# Patient Record
Sex: Male | Born: 1990 | Race: Black or African American | Hispanic: No | Marital: Single | Smoking: Never smoker
Health system: Southern US, Community
[De-identification: ages and names within clinical notes are randomized; demographics above are authoritative.]

## PROBLEM LIST (undated history)

## (undated) DIAGNOSIS — G8929 Other chronic pain: Secondary | ICD-10-CM

## (undated) DIAGNOSIS — M549 Dorsalgia, unspecified: Secondary | ICD-10-CM

---

## 2013-11-14 ENCOUNTER — Encounter (HOSPITAL_COMMUNITY): Payer: Self-pay | Admitting: Emergency Medicine

## 2013-11-14 ENCOUNTER — Emergency Department (HOSPITAL_COMMUNITY)
Admission: EM | Admit: 2013-11-14 | Discharge: 2013-11-14 | Disposition: A | Discharge status: Home / Self Care | Payer: Self-pay | Attending: Emergency Medicine | Admitting: Emergency Medicine

## 2013-11-14 ENCOUNTER — Emergency Department (HOSPITAL_COMMUNITY): Payer: Self-pay

## 2013-11-14 DIAGNOSIS — X500XXA Overexertion from strenuous movement or load, initial encounter: Secondary | ICD-10-CM | POA: Insufficient documentation

## 2013-11-14 DIAGNOSIS — G8929 Other chronic pain: Secondary | ICD-10-CM | POA: Insufficient documentation

## 2013-11-14 DIAGNOSIS — M5380 Other specified dorsopathies, site unspecified: Secondary | ICD-10-CM | POA: Insufficient documentation

## 2013-11-14 DIAGNOSIS — Y9389 Activity, other specified: Secondary | ICD-10-CM | POA: Insufficient documentation

## 2013-11-14 DIAGNOSIS — R52 Pain, unspecified: Secondary | ICD-10-CM | POA: Insufficient documentation

## 2013-11-14 DIAGNOSIS — M545 Low back pain: Secondary | ICD-10-CM

## 2013-11-14 DIAGNOSIS — IMO0002 Reserved for concepts with insufficient information to code with codable children: Secondary | ICD-10-CM | POA: Insufficient documentation

## 2013-11-14 DIAGNOSIS — Y929 Unspecified place or not applicable: Secondary | ICD-10-CM | POA: Insufficient documentation

## 2013-11-14 HISTORY — DX: Other chronic pain: G89.29

## 2013-11-14 HISTORY — DX: Dorsalgia, unspecified: M54.9

## 2013-11-14 MED ORDER — METHOCARBAMOL 500 MG PO TABS
500.0000 mg | ORAL_TABLET | Freq: Once | ORAL | Status: AC
  Administered 2013-11-14: 500 mg via ORAL
  Filled 2013-11-14: qty 1

## 2013-11-14 MED ORDER — NAPROXEN 250 MG PO TABS: 250.0000 mg | ORAL_TABLET | Freq: Two times a day (BID) | ORAL | Status: AC

## 2013-11-14 MED ORDER — IBUPROFEN 200 MG PO TABS
400.0000 mg | ORAL_TABLET | Freq: Once | ORAL | Status: AC
  Administered 2013-11-14: 400 mg via ORAL
  Filled 2013-11-14: qty 2

## 2013-11-14 MED ORDER — TRAMADOL HCL 50 MG PO TABS: 50.0000 mg | ORAL_TABLET | Freq: Four times a day (QID) | ORAL | Status: AC | PRN

## 2013-11-14 MED ORDER — METHOCARBAMOL 500 MG PO TABS: 1000.0000 mg | ORAL_TABLET | Freq: Four times a day (QID) | ORAL | Status: AC | PRN

## 2013-11-14 NOTE — ED Notes (Signed)
Bed: AC16WA10 Expected date:  Expected time:  Means of arrival:  Comments: EMS 23yo M back spasms

## 2013-11-14 NOTE — ED Notes (Signed)
Per EMS, pt. From his friend's house  Called EMS for back pain at 10/10 with spasm  at around 11 pm last night. Pt.claimed of having chronic back pain never been treated,pt. Stated he heard "pop" as he lifted that heavy object. No SOB.

## 2013-11-14 NOTE — ED Provider Notes (Signed)
CSN: [id number]     Arrival date & time 11/14/13  0041 History   First MD Initiated Contact with Patient 11/14/13 0048     Chief Complaint  Patient presents with  . Back Pain  . back spasm       HPI Pt was seen at 0105. Per pt, c/o gradual onset and persistence of constant acute flair of his chronic low back "pain" for the past several hours.  Describes the pain as "spasms."  States the pain began after he was "lifting something heavy" and "heard a pop." Denies any change in his usual chronic pain pattern.  Pain worsens with palpation of the area and body position changes. Pt took one tablet of his girlfriend's ultram PTA. Denies incont/retention of bowel or bladder, no saddle anesthesia, no focal motor weakness, no tingling/numbness in extremities, no fevers, no injury, no abd pain.   The symptoms have been associated with no other complaints. The patient has a significant history of similar symptoms previously.     Past Medical History  Diagnosis Date  . Chronic back pain    No past surgical history on file.  History  Substance Use Topics  . Smoking status: Never Smoker   . Smokeless tobacco: Not on file  . Alcohol Use: No    Review of Systems ROS: Statement: All systems negative except as marked or noted in the HPI; Constitutional: Negative for fever and chills. ; ; Eyes: Negative for eye pain, redness and discharge. ; ; ENMT: Negative for ear pain, hoarseness, nasal congestion, sinus pressure and sore throat. ; ; Cardiovascular: Negative for chest pain, palpitations, diaphoresis, dyspnea and peripheral edema. ; ; Respiratory: Negative for cough, wheezing and stridor. ; ; Gastrointestinal: Negative for nausea, vomiting, diarrhea, abdominal pain, blood in stool, hematemesis, jaundice and rectal bleeding. . ; ; Genitourinary: Negative for dysuria, flank pain and hematuria. ; ; Musculoskeletal: +LBP. Negative for neck pain. Negative for swelling and trauma.; ; Skin: Negative for  pruritus, rash, abrasions, blisters, bruising and skin lesion.; ; Neuro: Negative for headache, lightheadedness and neck stiffness. Negative for weakness, altered level of consciousness , altered mental status, extremity weakness, paresthesias, involuntary movement, seizure and syncope.        Allergies  Review of patient's allergies indicates no known allergies.  Home Medications   Prior to Admission medications   Not on File   BP 122/71  Pulse 82  Temp(Src) 98.8 F (37.1 C) (Oral)  Resp 18  SpO2 98% Physical Exam 0110: Physical examination:  Nursing notes reviewed; Vital signs and O2 SAT reviewed;  Constitutional: Well developed, Well nourished, Well hydrated, In no acute distress; Head:  Normocephalic, atraumatic; Eyes: EOMI, PERRL, No scleral icterus; ENMT: Mouth and pharynx normal, Mucous membranes moist; Neck: Supple, Full range of motion, No lymphadenopathy; Cardiovascular: Regular rate and rhythm, No murmur, rub, or gallop; Respiratory: Breath sounds clear & equal bilaterally, No rales, rhonchi, wheezes.  Speaking full sentences with ease, Normal respiratory effort/excursion; Chest: Nontender, Movement normal; Abdomen: Soft, Nontender, Nondistended, Normal bowel sounds; Genitourinary: No CVA tenderness; Spine:  No midline CS, TS, LS tenderness. +TTP right lumbar paraspinal muscles.;; Extremities: Pulses normal, No tenderness, No edema, No calf edema or asymmetry.; Neuro: AA&Ox3, Major CN grossly intact.  Speech clear. No gross focal motor or sensory deficits in extremities. Strength 5/5 equal bilat UE's and LE's, including great toe dorsiflexion.  DTR 2/4 equal bilat UE's and LE's.  No gross sensory deficits.  Neg straight leg raises bilat. Climbs on  and off stretcher easily by himself. Gait steady.; Skin: Color normal, Warm, Dry.   ED Course  Procedures     MDM  MDM Reviewed: previous chart, nursing note and vitals Interpretation: x-ray    Dg Lumbar Spine  Complete 11/14/2013   CLINICAL DATA:  Back pain  EXAM: LUMBAR SPINE - COMPLETE 4+ VIEW  COMPARISON:  None.  FINDINGS: Normal alignment of lumbar vertebral bodies. No loss of vertebral body height or disc height. No pars fracture. No subluxation.  IMPRESSION: Normal lumbar spine radiographs   Electronically Signed   By: Genevive BiStewart  Edmunds M.D.   On: 11/14/2013 01:47    0210:   XR reassuring. No red flags on HPI/PE. Neuro exam remains intact and unchanged. Will tx symptomatically at this time. Dx and testing d/w pt and family.  Questions answered.  Verb understanding, agreeable to d/c home with outpt f/u.   Laray AngerKathleen M Vernal Rutan, DO 11/16/13 1807

## 2013-11-14 NOTE — ED Notes (Signed)
Patient transported to X-ray 

## 2013-11-14 NOTE — Discharge Instructions (Signed)
°Emergency Department Resource Guide °1) Find a Doctor and Pay Out of Pocket °Although you won't have to find out who is covered by your insurance plan, it is a good idea to ask around and get recommendations. You will then need to call the office and see if the doctor you have chosen will accept you as a new patient and what types of options they offer for patients who are self-pay. Some doctors offer discounts or will set up payment plans for their patients who do not have insurance, but you will need to ask so you aren't surprised when you get to your appointment. ° °2) Contact Your Local Health Department °Not all health departments have doctors that can see patients for sick visits, but many do, so it is worth a call to see if yours does. If you don't know where your local health department is, you can check in your phone book. The CDC also has a tool to help you locate your state's health department, and many state websites also have listings of all of their local health departments. ° °3) Find a Walk-in Clinic °If your illness is not likely to be very severe or complicated, you may want to try a walk in clinic. These are popping up all over the country in pharmacies, drugstores, and shopping centers. They're usually staffed by nurse practitioners or physician assistants that have been trained to treat common illnesses and complaints. They're usually fairly quick and inexpensive. However, if you have serious medical issues or chronic medical problems, these are probably not your best option. ° °No Primary Care Doctor: °- Call [facility] at  832-8000 - they can help you locate a primary care doctor that  accepts your insurance, provides certain services, etc. °- Physician Referral Service- 1-800-533-3463 ° °Chronic Pain Problems: °Organization         Address  Phone   Notes  °[facility]  (336) 297-2271 Patients need to be referred by their primary care doctor.  ° °Medication  Assistance: °Organization         Address  Phone   Notes  °[place] [facility] [address] °[place], [place] [address] (336) 641-8030 --Must be a resident of [place] °-- Must have NO insurance coverage whatsoever (no Medicaid/ Medicare, etc.) °-- The pt. MUST have a primary care doctor that directs their care regularly and follows them in the community °  °[facility]  (866) 331-1348   °[employer]  (888) 892-1162   ° °Agencies that provide inexpensive medical care: °Organization         Address  Phone   Notes  °[facility]  (336) 832-8035   °[facility]    (336) 832-7272   °[facility] [address] °[place], [place] [address] (336) 832-4777   °[facility] [address], °[place] (336) 271-4999   °[facility]    (336) 373-0678   °[facility]    (336) 272-1050   °[facility] ° [address], [place] Phone:  (336) 832-4444, Fax:  (336) 832-4440 Hours of Operation:  9 am - 6 pm, M-F.  Also accepts Medicaid/Medicare and self-pay.  °[facility] ° [address], [place] Phone: (336) 832-3150, Fax: (336) 832-3151. Hours of Operation:  8:30 am - 5:30 pm, M-F.  Also accepts Medicaid and self-pay.  °[facility] [place] [address], [place] Phone: (336) 878-6027   °[facility] [address], [place], [place] (336)723-1848, Ext. 123 Mondays & Thursdays: 7-9 AM.  First 15 patients are seen on a first come, first serve basis. °  ° °Medicaid-accepting [place] Providers: ° °Organization         Address  Phone   Notes  °[facility] [address], [place] (336) 641-2100 Also accepts self-pay patients.  °[facility] [address], [place] ° (336) 856-9996   °[facility] [address], [place]  (336) 288-8857   °[facility] [address] [place] [address], [place] (336) 299-7000   °Veita Bland [address], [place]  ° (336) 373-1557 Only accepts [place] Access Medicaid patients after they have their name applied to their card.  ° °Self-Pay (no insurance) in [place]: ° °Organization         Address  Phone   Notes  °Sickle Cell Patients, [facility] [address], [place] (336) 832-1970   °[facility] [address], [place] (336) 832-4400   °[facility] ° [address],  (336) 992-4800   °[facility]/Dr. Osei-Bonsu ° [address] [place] [address], [place] or [address], [place] (336) 841-8500 Phone number for both [place] and [place] locations is the same.  °[facility] [address], [place] (336) 299-0000   °[facility] [address] [place] [address], [place] or [address] (336) 852-7530 °(336) 878-2260   °[facility] [address], [place] (336) 350-1642, phone; (336) 294-5005, fax Sees patients 1st and 3rd Saturday of every month.  Must not qualify for public or private insurance (i.e. Medicaid, Medicare, [place] Health Choice, Veterans' Benefits) • Household income should be no more than 200% of the poverty level •The clinic cannot treat you if you are pregnant or think you are pregnant • Sexually transmitted diseases are not treated at the clinic.  ° ° °Dental Care: °Organization         Address  Phone  Notes  °[place] [facility] [address], [place] (336) 641-6152 Accepts children up to age 21 who are enrolled in Medicaid or [place] Health Choice; pregnant women with a Medicaid card; and children who have applied for Medicaid or [place] Health Choice, but were declined, whose parents can pay a reduced fee at time of service.  °[place]  [facility] [place]  [address], [place] (336) 641-7733 Accepts children up to age 21 who are enrolled in Medicaid or [place] Health Choice; pregnant women with a Medicaid card; and children who have applied for Medicaid or [place] Health Choice, but were declined, whose parents can pay a reduced fee at time of service.  °[facility] ° [address],  (336) 641-4533 Patients are seen by appointment only. Walk-ins are not accepted. [facility] will see patients 18 years of age and older. °Monday - Tuesday (8am-5pm) °Most Wednesdays (8:30-5pm) °$30 per visit, cash only  °[facility] ° [address], [place] (336) 641-4533 Patients are seen by appointment only. Walk-ins are not accepted. [facility] will see patients 18 years of age and older. °One   Wednesday Evening (Monthly: Volunteer Based).  $30 per visit, cash only  °[facility]  (919) 537-3737 for adults; Children under age 4, call Graduate Pediatric Dentistry at (919) [phone]. Children aged 4-14, please call (919) [phone] to request a pediatric application. ° Dental services are provided in all areas of dental care including fillings, crowns and bridges, complete and partial dentures, implants, gum treatment, root canals, and extractions. Preventive care is also provided. Treatment is provided to both adults and children. °Patients are selected via a lottery and there is often a waiting list. °  °[facility] [address], °[place] ° (336) 763-8833 [email or web address] °  °[facility] [address], [place], [place] (336)723-1848, Ext. 123 Second and Fourth Thursday of each month, opens at 6:30 AM; Clinic ends at 9 AM.  Patients are seen on a first-come first-served basis, and a limited number are seen during each clinic.  ° °[facility] ° [address], [place], [place] (336) 723-7904    Eligibility Requirements °You must have lived in [place], [place], or [place] counties for at least the last three months. °  You cannot be eligible for state or federal sponsored healthcare insurance, including Veterans Administration, Medicaid, or Medicare. °  You generally cannot be eligible for healthcare insurance through your employer.  °  How to apply: °Eligibility screenings are held every Tuesday and Wednesday afternoon from 1:00 pm until 4:00 pm. You do not need an appointment for the interview!  °[facility] [address], [place], [place] 336-631-2330   °[facility]  336-342-8273   °[facility]  336-703-3100   °[facility]  336-570-6415   ° °Behavioral Health Resources in the Community: °Intensive Outpatient Programs °Organization         Address  Phone  Notes  °[facility] [address], [place], [place] 336-878-6098   °[facility] [address], [place], [place] 336-832-9800   °[facility] [address], [place], [place] ° 336-882-2125   °[facility] [address],  °[place], [place] 1-800-853-5163 or 336-641-4981   °Substance Abuse Resources °Organization         Address  Phone  Notes  °[facility]  336-882-2125   °[facility]  336-784-9470   °[facility]  336-285-9073   °[facility]  336-845-3988   °Residential & Outpatient Substance Abuse Program  1-800-659-3381   °Psychological Services °Organization         Address  Phone  Notes  °[facility]  336- 832-9600   °[facility]  336- 378-7881   °[facility] [address], [place] 1-800-853-5163 or 336-641-4981   ° °Mobile Crisis Teams °Organization         Address  Phone  Notes  °[facility], Mobile Crisis Care Unit  1-877-626-1772   °Assertive °[facility] ° [address]  [place], [place] 336-834-9664   °Sharon DeEsch [address] °[place] 336-554-5454   ° °Self-Help/Support Groups °Organization         Address  Phone             Notes  °[facility] - variety of support groups  336- 373-1402 Call for more information  °Narcotics Anonymous (NA), [facility] [address], °[place]  2 meetings at this location  ° °  Residential Treatment Programs °Organization         Address  Phone  Notes  °[facility] [address],    °[place]  1-866-801-8205   °[facility] ° [address], [place], [place] 704-293-8524   °[facility] [address], [place] 336-845-3988 Admissions: 8am-3pm M-F  °[facility] [address]    °[place], [place] 336-841-1104   °[facility] [address], [place], [place] 336-379-7146   °[facility] [address]  °[place], [place] 336-285-9073   °[facility] - Intensive Outpatient [address], [place], [place] 336-852-3033   °[facility] [address]  °[place], [place] 1-877-615-2722 or 336-784-9470   °[facility] [address] [place], [place] 336-227-7417 Accepts Medicaid  °[facility] [address]  °[place] 1-800-659-3381 Substance Abuse/Addiction Treatment  ° °[place] Behavioral Health Resources °Organization         Address  Phone  Notes  °[facility]  (888) 581-9988   °Julie Brannon, PhD [address] [place], [place]   (336) 349-5553 or (336) 951-0000   °[facility]   [address] °[place], [place] (336) 349-4454   °[facility] [address], [place], [place] (336) 342-8316 Insurance/Medicaid/sponsorship through [facility]  °[facility] [address]                                    [place], [place] (336) 342-8316 Therapy/tele-psych/case    °[facility] [address]  ° [place], [place] (336) 349-2233    °Dr. Arfeen  (336) 349-4544   °[facility] [place]  [employer] [place] [facility] 1) [address], [place] °2) [address], [place] °3)  [address], [place] (336) 349-3220 °(336) 342-7768 ° °(336) 342-8140   °[place] [facility] (336) 342-1394 or (336) 342-3537 (After Hours)    ° ° °Take the prescriptions as directed.  Apply moist heat or ice to the area(s) of discomfort, for 15 minutes at a time, several times per day for the next few days.  Do not fall asleep on a heating or ice pack.  Call your regular medical doctor on Monday to schedule a follow up appointment this week.  Return to the Emergency Department immediately if worsening. ° °

## 2015-01-31 IMAGING — CR DG LUMBAR SPINE COMPLETE 4+V
5 series · 5 of 5 positions shown · non-contrast
Comparison: None.

CLINICAL DATA: Back pain

EXAM:
LUMBAR SPINE - COMPLETE 4+ VIEW

[t lumbar spine ap]
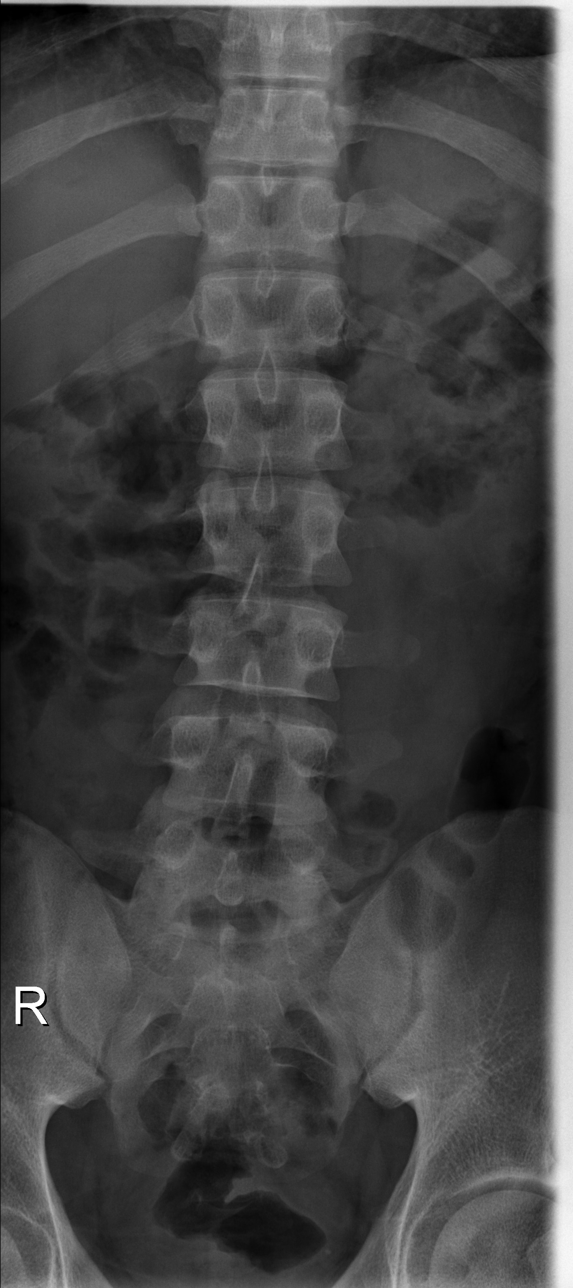

[t lumbar spine obl (1 of 2)]
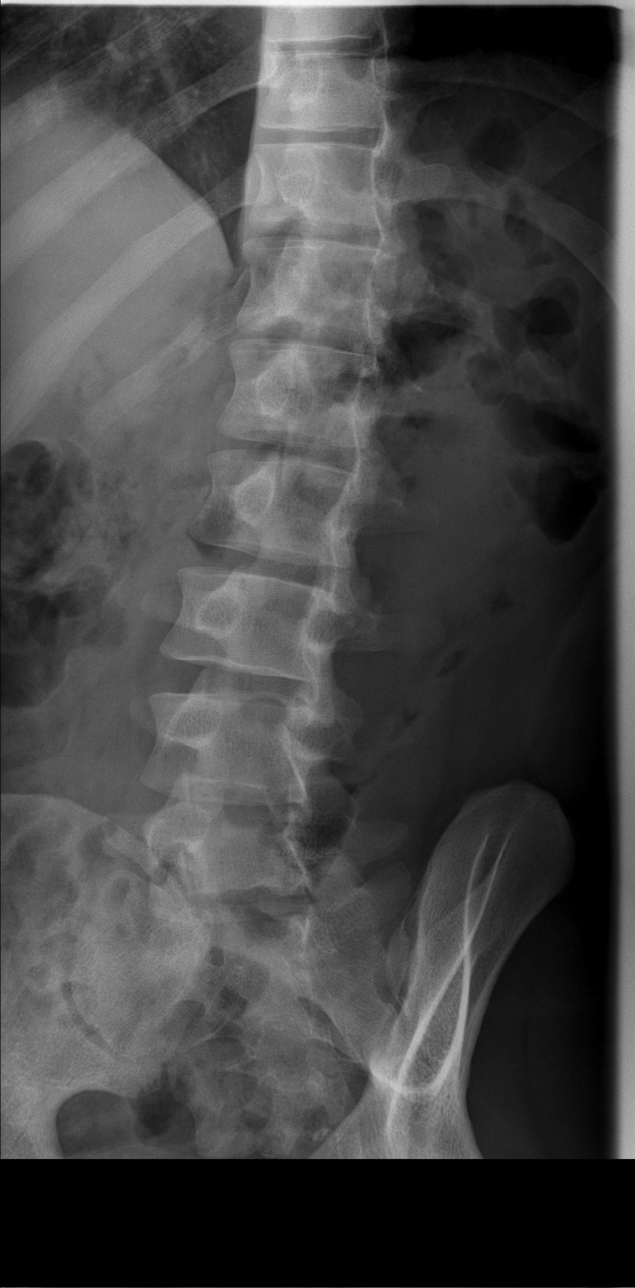

[t lumbar spine obl (2 of 2)]
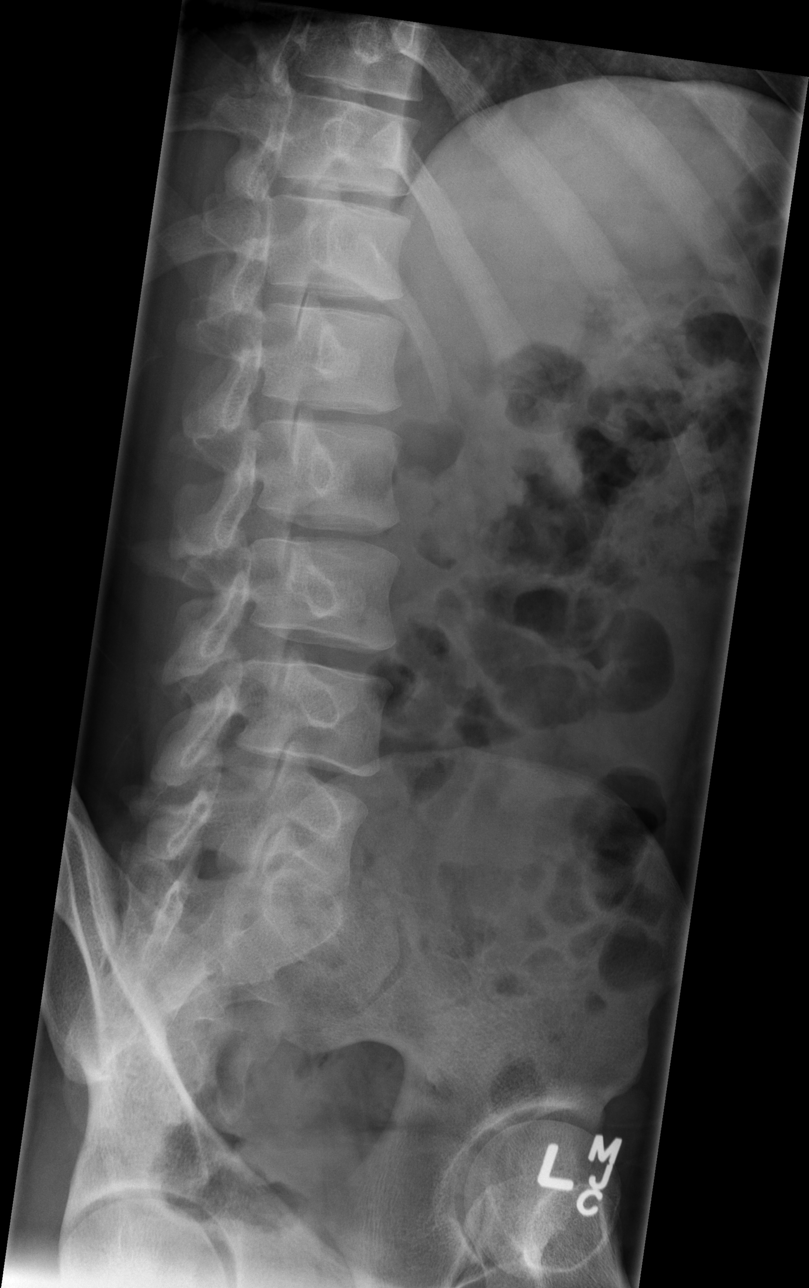

[t lumbar spine lat]
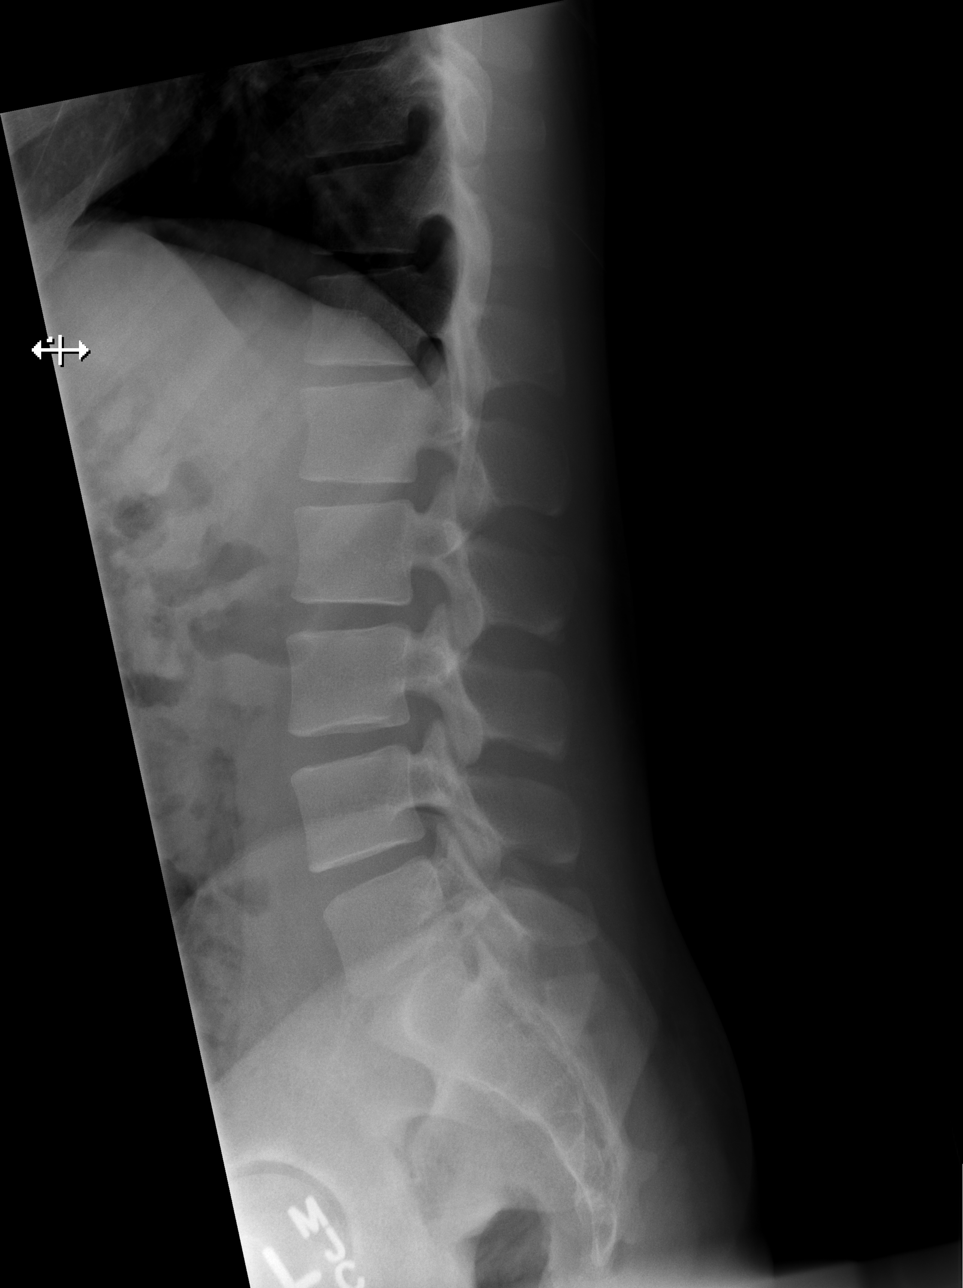

[t lumbar l-5 s-1 spot]
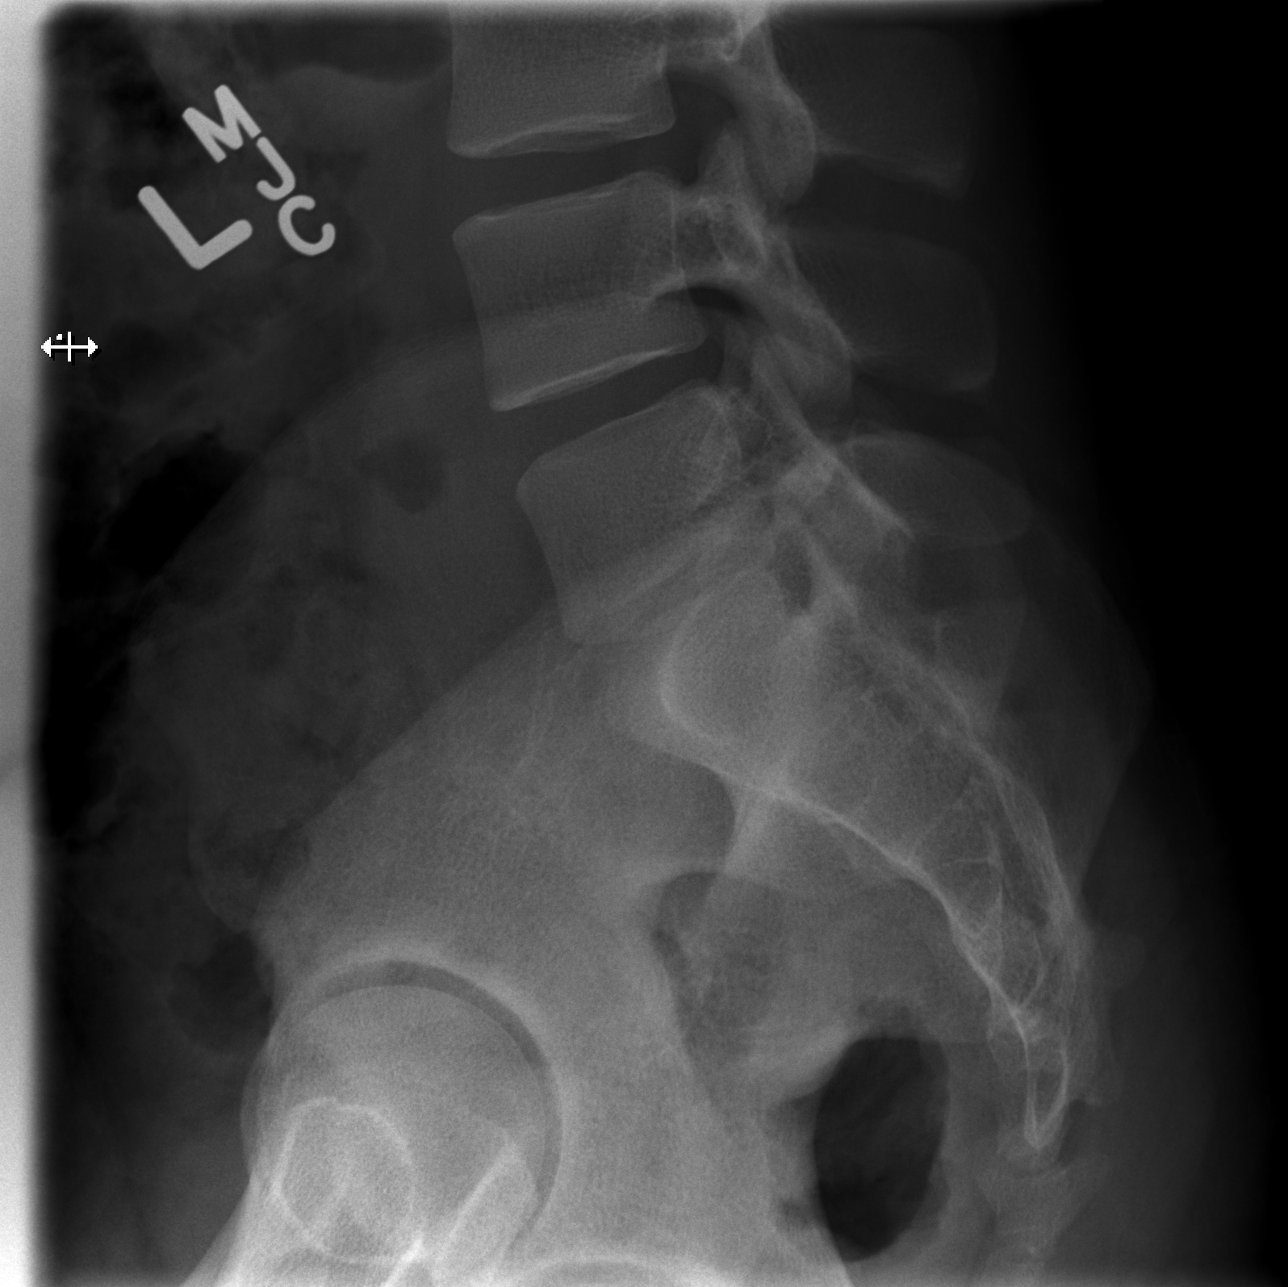

[5 of 5 positions shown; findings below may reference images not displayed]

FINDINGS: Normal alignment of lumbar vertebral bodies. No loss of vertebral
body height or disc height. No pars fracture. No subluxation.
IMPRESSION: Normal lumbar spine radiographs
